# Patient Record
Sex: Male | Born: 1970 | Race: Black or African American | Smoking: Never smoker
Health system: Southern US, Community
[De-identification: ages and names within clinical notes are randomized; demographics above are authoritative.]

---

## 2020-08-26 ENCOUNTER — Ambulatory Visit
Admission: RE | Admit: 2020-08-26 | Discharge: 2020-08-26 | Disposition: A | Discharge status: Home / Self Care | Payer: 59 | Source: Ambulatory Visit | Attending: Orthopedic Surgery | Admitting: Orthopedic Surgery

## 2020-08-26 ENCOUNTER — Other Ambulatory Visit: Payer: Self-pay | Admitting: Orthopedic Surgery

## 2020-08-26 DIAGNOSIS — S72002A Fracture of unspecified part of neck of left femur, initial encounter for closed fracture: Secondary | ICD-10-CM

## 2020-08-29 ENCOUNTER — Other Ambulatory Visit: Payer: Self-pay

## 2020-08-29 ENCOUNTER — Emergency Department (HOSPITAL_BASED_OUTPATIENT_CLINIC_OR_DEPARTMENT_OTHER)
Admission: EM | Admit: 2020-08-29 | Discharge: 2020-08-29 | Disposition: A | Discharge status: Home / Self Care | Payer: 59 | Attending: Emergency Medicine | Admitting: Emergency Medicine

## 2020-08-29 ENCOUNTER — Encounter (HOSPITAL_BASED_OUTPATIENT_CLINIC_OR_DEPARTMENT_OTHER): Payer: Self-pay | Admitting: Emergency Medicine

## 2020-08-29 DIAGNOSIS — H9311 Tinnitus, right ear: Secondary | ICD-10-CM | POA: Insufficient documentation

## 2020-08-29 DIAGNOSIS — H9203 Otalgia, bilateral: Secondary | ICD-10-CM | POA: Diagnosis present

## 2020-08-29 DIAGNOSIS — H9312 Tinnitus, left ear: Secondary | ICD-10-CM | POA: Insufficient documentation
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
## 2020-08-29 NOTE — ED Notes (Signed)
Patient verbalizes understanding of discharge instructions. Opportunity for questioning and answers were provided. Armband removed by staff, pt discharged from ED ambulatory to home.  
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
## 2020-09-09 ENCOUNTER — Telehealth: Payer: Self-pay | Admitting: Family Medicine
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
## 2020-09-10 NOTE — Telephone Encounter (Signed)
Spoke to pt and he would like to attempt to get in w/ ENT first before scheduling w/ [place].  Pt will call that office and if he cannot be seen w/I the next week, then he will call us back to schedule.  Please schedule in any 30 min slot between 8 am - 3:30 pm.
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
